# Patient Record
Sex: Male | Born: 1975 | Race: Black or African American | Hispanic: No | Marital: Married | Smoking: Current every day smoker
Health system: Southern US, Community
[De-identification: ages and names within clinical notes are randomized; demographics above are authoritative.]

## PROBLEM LIST (undated history)

## (undated) DIAGNOSIS — J45909 Unspecified asthma, uncomplicated: Secondary | ICD-10-CM

## (undated) DIAGNOSIS — J9819 Other pulmonary collapse: Secondary | ICD-10-CM

---

## 2018-08-28 ENCOUNTER — Encounter (HOSPITAL_COMMUNITY): Payer: Self-pay

## 2018-08-28 ENCOUNTER — Other Ambulatory Visit: Payer: Self-pay

## 2018-08-28 ENCOUNTER — Emergency Department (HOSPITAL_COMMUNITY)
Admission: EM | Admit: 2018-08-28 | Discharge: 2018-08-28 | Disposition: A | Discharge status: Home / Self Care | Payer: Self-pay | Attending: Emergency Medicine | Admitting: Emergency Medicine

## 2018-08-28 ENCOUNTER — Emergency Department (HOSPITAL_COMMUNITY): Payer: Self-pay

## 2018-08-28 ENCOUNTER — Encounter (HOSPITAL_COMMUNITY): Payer: Self-pay | Admitting: Emergency Medicine

## 2018-08-28 DIAGNOSIS — F17210 Nicotine dependence, cigarettes, uncomplicated: Secondary | ICD-10-CM | POA: Insufficient documentation

## 2018-08-28 DIAGNOSIS — R51 Headache: Secondary | ICD-10-CM | POA: Insufficient documentation

## 2018-08-28 DIAGNOSIS — J45909 Unspecified asthma, uncomplicated: Secondary | ICD-10-CM | POA: Insufficient documentation

## 2018-08-28 DIAGNOSIS — L84 Corns and callosities: Secondary | ICD-10-CM | POA: Insufficient documentation

## 2018-08-28 DIAGNOSIS — Z5321 Procedure and treatment not carried out due to patient leaving prior to being seen by health care provider: Secondary | ICD-10-CM | POA: Insufficient documentation

## 2018-08-28 DIAGNOSIS — M25551 Pain in right hip: Secondary | ICD-10-CM | POA: Insufficient documentation

## 2018-08-28 DIAGNOSIS — M79661 Pain in right lower leg: Secondary | ICD-10-CM | POA: Insufficient documentation

## 2018-08-28 DIAGNOSIS — M25561 Pain in right knee: Secondary | ICD-10-CM | POA: Insufficient documentation

## 2018-08-28 HISTORY — DX: Other pulmonary collapse: J98.19

## 2018-08-28 HISTORY — DX: Unspecified asthma, uncomplicated: J45.909
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
## 2018-08-28 NOTE — ED Triage Notes (Signed)
Patient complaining of right hip pain and left foot pain. Patient states he has been taking advil and now the advil is not working.

## 2018-08-29 ENCOUNTER — Emergency Department (HOSPITAL_COMMUNITY)
Admission: EM | Admit: 2018-08-29 | Discharge: 2018-08-29 | Disposition: A | Discharge status: Home / Self Care | Payer: Self-pay | Attending: Emergency Medicine | Admitting: Emergency Medicine

## 2018-08-29 DIAGNOSIS — M25551 Pain in right hip: Secondary | ICD-10-CM

## 2018-08-29 DIAGNOSIS — L84 Corns and callosities: Secondary | ICD-10-CM

## 2018-08-29 MED ORDER — NAPROXEN 375 MG PO TABS: ORAL_TABLET | ORAL | 0 refills | Status: DC

## 2018-08-29 MED ORDER — NAPROXEN 375 MG PO TABS
375.0000 mg | ORAL_TABLET | Freq: Once | ORAL | Status: AC
  Administered 2018-08-29: 375 mg via ORAL
  Filled 2018-08-29: qty 1

## 2018-08-29 MED ORDER — HYDROCODONE-ACETAMINOPHEN 5-325 MG PO TABS: 1.0000 | ORAL_TABLET | Freq: Four times a day (QID) | ORAL | 0 refills | Status: AC | PRN
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
## 2018-09-11 ENCOUNTER — Encounter (HOSPITAL_COMMUNITY): Payer: Self-pay

## 2018-09-11 ENCOUNTER — Emergency Department (HOSPITAL_COMMUNITY)
Admission: EM | Admit: 2018-09-11 | Discharge: 2018-09-11 | Disposition: A | Discharge status: Home / Self Care | Payer: Self-pay | Attending: Emergency Medicine | Admitting: Emergency Medicine

## 2018-09-11 ENCOUNTER — Other Ambulatory Visit: Payer: Self-pay

## 2018-09-11 DIAGNOSIS — J45909 Unspecified asthma, uncomplicated: Secondary | ICD-10-CM | POA: Insufficient documentation

## 2018-09-11 DIAGNOSIS — F17210 Nicotine dependence, cigarettes, uncomplicated: Secondary | ICD-10-CM | POA: Insufficient documentation

## 2018-09-11 DIAGNOSIS — M5441 Lumbago with sciatica, right side: Secondary | ICD-10-CM | POA: Insufficient documentation

## 2018-09-11 DIAGNOSIS — M79604 Pain in right leg: Secondary | ICD-10-CM | POA: Insufficient documentation

## 2018-09-11 MED ORDER — NAPROXEN 375 MG PO TABS: ORAL_TABLET | ORAL | 0 refills | Status: AC

## 2018-09-11 MED ORDER — METHOCARBAMOL 500 MG PO TABS: 500.0000 mg | ORAL_TABLET | Freq: Two times a day (BID) | ORAL | 0 refills | Status: AC

## 2018-09-11 MED ORDER — PREDNISONE 20 MG PO TABS: 20.0000 mg | ORAL_TABLET | Freq: Two times a day (BID) | ORAL | 0 refills | Status: AC

## 2018-09-11 NOTE — Discharge Instructions (Addendum)
Please see the information and instructions below regarding your visit.  Your diagnoses today include:  1. Acute right-sided low back pain with right-sided sciatica   2. Right leg pain    About diagnosis. Most episodes of acute low back pain are self-limited. Your exam was reassuring today that the source of your pain is not affecting the spinal cord and nerves that originate in the spinal cord.   If you have a history of disc herniation or arthritis in your spine, the nerves exiting the spine on one side get inflamed. This can cause severe pain. We call this radiculopathy. We do not always know what causes the sudden inflammation.  Tests performed today include: See side panel of your discharge paperwork for testing performed today. Vital signs are listed at the bottom of these instructions.   Medications prescribed:    Take any prescribed medications only as prescribed, and any over the counter medications only as directed on the packaging.  You are prescribed prednisone, a steroid. This is a medication to help reduce inflammation in the spine.  Common side effects include upset stomach/nausea. You may take this medicine with food if this occurs. Other side effects include restlessness, difficulty sleeping, and increased sweating. Call your healthcare provider if these do not resolve after finishing the medication.  This medicine may increase your blood sugar so additional careful monitoring is needed of blood sugar if you have diabetes. Call your healthcare provider for any signs/symtpoms of high blood sugar such as confusion, feeling sleepy, more thirst, more hunger, passing urine more often, flushing, fast breathing, or breath that smells like fruit.  You are prescribed naproxen, a non-steroidal anti-inflammatory agent (NSAID) for pain. You may take 500 mg every 12 hours as needed for pain. If still requiring this medication around the clock for acute pain after 10 days, please see your  primary healthcare provider.  I recommend only taking this medicine for another 5 days.  Women who are pregnant, breastfeeding, or planning on becoming pregnant should not take non-steroidal anti-inflammatories such as Advil and Aleve. Tylenol is a safe over the counter pain reliever in pregnant women.  You may combine this medication with Tylenol, 650 mg every 6 hours, so you are receiving something for pain every 3 hours.  This is not a long-term medication unless under the care and direction of your primary provider. Taking this medication long-term and not under the supervision of a healthcare provider could increase the risk of stomach ulcers, kidney problems, and cardiovascular problems such as high blood pressure.   You are prescribed Robaxin, a muscle relaxant. Some common side effects of this medication include:  Feeling sleepy.  Dizziness. Take care upon going from a seated to a standing position.  Dry mouth.  Feeling tired or weak.  Hard stools (constipation).  Upset stomach. These are not all of the side effects that may occur. If you have questions about side effects, call your doctor. Call your primary care provider for medical advice about side effects.  This medication can be sedating. Only take this medication as needed. Please do not combine with alcohol. Do not drive or operate machinery while taking this medication.   This medication can interact with some other medications. Make sure to tell any provider you are taking this medication before they prescribe you a new medication.   Home care instructions:   Low back pain gets worse the longer you stay stationary. Please keep moving and walking as tolerated. There are exercises  included in this packet to perform as tolerated for your low back pain.   Apply heat to the areas that are painful. Avoid twisting or bending your trunk to lift something. Do not lift anything above 25 lbs while recovering from this flare of low back  pain.  Please follow any educational materials contained in this packet.   Follow-up instructions: Please follow-up with your primary care provider as soon as possible for further evaluation of your symptoms if they are not completely improved.   Return instructions:  Please return to the Emergency Department if you experience worsening symptoms.  Please return for any fever or chills in the setting of your back pain, weakness in the muscles of the legs, numbness in your legs and feet that is new or changing, numbness in the area where you wipe, retention of your urine, loss of bowel or bladder control, or problems with walking. Please return if you have any other emergent concerns.  Additional Information:   Your vital signs today were: BP 127/81 (BP Location: Left Arm)    Pulse 86    Temp 98.6 F (37 C) (Oral)    Resp 16    Ht 5\' 7"  (1.702 m)    Wt 75.8 kg    SpO2 97%    BMI 26.16 kg/m  If your blood pressure (BP) was elevated on multiple readings during this visit above 130 for the top number or above 80 for the bottom number, please have this repeated by your primary care provider within one month. --------------  Thank you for allowing [place] to participate in your care today.
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
